# Patient Record
Sex: Female | Born: 1950 | Hispanic: Yes | Smoking: Former smoker
Health system: Northeastern US, Academic
[De-identification: ages and names within clinical notes are randomized; demographics above are authoritative.]

---

## 2018-03-09 LAB — MICROALBUMIN URINE (EXT)
Albumin, urine (INT/EXT): 3.8 mg/dL — ABNORMAL HIGH (ref 0.0–1.9)
Creatinine, urine, random (INT/EXT): 94.5 mg/dL
Microalbumin/Creatinine Ratio Urine (EXT): 40.54 ug/mg — ABNORMAL HIGH (ref 0.00–24.90)

## 2019-05-02 LAB — HEMOGLOBIN A1C
Estimated Average Glucose mg/dL (INT/EXT): 220.2 mg/dL — ABNORMAL HIGH (ref 85.0–126.0)
HEMOGLOBIN A1C % (INT/EXT): 9.3 % — ABNORMAL HIGH (ref 4.0–6.0)

## 2020-08-19 LAB — UNMAPPED LAB RESULTS: HEPATITIS C ANTIBODY (EXT): NOT DETECTED

## 2021-04-04 ENCOUNTER — Encounter (HOSPITAL_BASED_OUTPATIENT_CLINIC_OR_DEPARTMENT_OTHER): Admitting: Neurological Surgery

## 2021-04-04 ENCOUNTER — Ambulatory Visit: Admit: 2021-04-04 | Discharge: 2021-04-04 | Payer: MEDICARE | Attending: Neurological Surgery

## 2021-04-04 ENCOUNTER — Other Ambulatory Visit

## 2021-04-04 ENCOUNTER — Encounter

## 2021-04-04 LAB — ELECTROLYTES PANEL
Anion Gap: 9 mmol/L (ref 3–14)
CO2 (Bicarbonate): 28 mmol/L (ref 20–32)
Chloride: 97 mmol/L — ABNORMAL LOW (ref 98–110)
Potassium: 5.1 mmol/L (ref 3.6–5.2)
Sodium: 134 mmol/L — ABNORMAL LOW (ref 135–146)

## 2021-04-04 LAB — CBC
Hematocrit: 46.6 % (ref 32.0–47.0)
Hemoglobin: 15.8 g/dL (ref 11.0–16.0)
MCH: 31.4 pg (ref 26.0–34.0)
MCHC: 33.9 g/dL (ref 31.0–37.0)
MCV: 92.6 fL (ref 80.0–100.0)
MPV: 9.1 fL (ref 9.1–12.4)
NRBC %: 0 % (ref 0.0–0.0)
NRBC Absolute: 0 10*3/uL (ref 0.00–2.00)
Platelets: 163 10*3/uL (ref 150–400)
RBC: 5.03 M/uL (ref 3.70–5.20)
RDW-CV: 12.7 % (ref 11.5–14.5)
RDW-SD: 42.7 fL (ref 35.0–51.0)
WBC: 5.4 10*3/uL (ref 4.0–11.0)

## 2021-04-04 LAB — CREATININE, SERUM
Creatinine: 0.89 mg/dL (ref 0.55–1.30)
eGFRcr: 70 mL/min/{1.73_m2} (ref >=60)

## 2021-04-04 LAB — GLUCOSE: Glucose: 362 mg/dL — ABNORMAL HIGH (ref 70–139)

## 2021-04-04 LAB — PROTIME-INR
INR: 0.97
Protime: 11.6 s (ref 9.7–14.0)

## 2021-04-04 LAB — PTT: aPTT: 34.6 s (ref 25.7–35.7)

## 2021-04-04 LAB — BUN: BUN: 14 mg/dL (ref 6–24)

## 2021-04-04 NOTE — Progress Notes (Signed)
 Neurosurgery Office Visit     Date of Service: 04/04/2021    Patient: Laura Cunningham  DOB: 10-18-1950  MRN#: [id number]  Primary Care MD: Estil Daft, MD  Referring MD: Estil Daft, MD    Chief Complaint: New patient: right VA stenosis, mid left PCA stenosis     History of Present Illness:  Laura Cunningham is an 70 y.o. 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 She experiences occipital and bitemporal headaches that occurs every day for the past 2 years. She describes these headaches as pressure-like and throbbing that is not relieved with ibuprofen or tylenol. Associated with these headaches is nausea, dry-heaving, photo/phonophobia. Due to fibromyalgia and diabetes, she has peripheral neuropathy and overall generalized weakness.     Medical/Surgical History:  Past Medical History:   Diagnosis Date   . Asthma    . Cervical disc disorder    . Diabetes mellitus (CMS/HCC)    . Gait abnormality    . Hypertension    . Liver disease    . Low back pain    . Osteoporosis    . Peripheral neuropathy      Past Surgical History:   Procedure Laterality Date   . CHOLECYSTECTOMY     . ELBOW SURGERY Right    . ESOPHAGUS SURGERY     . HYSTERECTOMY     . KNEE SURGERY Right     x 2     There is no problem list on file for this patient.    Medications:    Current Outpatient Medications:   .  albuterol 90 mcg/actuation inhaler, Inhale 2 puffs every 6 (six) hours if needed for wheezing., Disp: , Rfl:   .  aspirin 81 mg EC tablet, Take 81 mg by mouth., Disp: , Rfl:   .  blood sugar diagnostic (Blood Glucose Test) strip, , Disp: , Rfl:   .  budesonide (Pulmicort) 0.5 mg/2 mL nebulizer solution, Inhale 0.5 mg., Disp: , Rfl:   .  calcium carbonate-vitamin D3 500 mg-5 mcg (200 unit) tablet, Take 1 tablet by mouth in the morning., Disp: , Rfl:   .  carvedilol (Coreg) 25 mg tablet, Take 25 mg by mouth.,  Disp: , Rfl:   .  clotrimazole (Lotrimin) 1 % vaginal cream, Insert into the vagina in the morning., Disp: , Rfl:   .  diclofenac (Voltaren) 1 % topical gel, Apply topically if needed in the morning, at noon, in the evening, and at bedtime for pain., Disp: , Rfl:   .  DILTIAZEM HCL ORAL, Take 90 mg by mouth., Disp: , Rfl:   .  FAMOTIDINE ORAL, Take 20 mg by mouth., Disp: , Rfl:   .  flash glucose sensor (FREESTYLE LIBRE 2 SENSOR MISC), , Disp: , Rfl:   .  fluconazole (Diflucan) 200 mg tablet, Take 200 mg by mouth in the morning., Disp: , Rfl:   .  FreeStyle glucose monitoring kit, 1 each if needed., Disp: , Rfl:   .  FREESTYLE LANCETS MISC, , Disp: , Rfl:   .  glimepiride (Amaryl) 1 mg tablet, Take 1 mg by mouth before breakfast., Disp: , Rfl:   .  HumaLOG KwikPen Insulin 100 unit/mL injection, Inject 30 Units under the skin in the morning, at noon, and at bedtime., Disp: , Rfl:   .  hydroxychloroquine (Plaquenil) 200 mg tablet, Take 200 mg by mouth., Disp: ,  Rfl:   .  insulin glargine,hum.rec.anlog (BASAGLAR KWIKPEN U-100 INSULIN SUBQ), Inject 100 Units under the skin., Disp: , Rfl:   .  lancets 30 gauge misc, , Disp: , Rfl:   .  losartan (Cozaar) 50 mg tablet, Take 50 mg by mouth in the morning., Disp: , Rfl:   .  lutein/zeaxanthin (OCUVITE LUTEIN 25 ORAL), Take by mouth., Disp: , Rfl:   .  montelukast (Singulair) 10 mg tablet, Take 10 mg by mouth at bedtime., Disp: , Rfl:   .  nitroglycerin (NitrolinguaL) 400 mcg/spray spray, Place 1 spray under the tongue every 5 (five) minutes if needed for chest pain., Disp: , Rfl:   .  pen needle, diabetic (Comfort EZ Pen Needles) 31 gauge x 5/16" needle, , Disp: , Rfl:   .  polyethylene glycol (Glycolax) 17 gram packet, Take 17 g by mouth in the morning., Disp: , Rfl:   .  sertraline (Zoloft) 50 mg tablet, Take 1 tablet by mouth in the morning., Disp: , Rfl:   .  syringe-needle,insulin,0.5 mL (INSULIN SYRINGE MISC), , Disp: , Rfl:   .  triamcinolone (Kenalog) 0.5 % ointment,  Apply topically in the morning and at bedtime., Disp: , Rfl:      Problems and Medications Reviewed.    Allergies:  Patient has no known allergies.     Family History:  Family History   Problem Relation Name Age of Onset   . Cancer Mother     . Breast cancer Sister     . Intestinal malrotation Sister       Social History:   reports that she quit smoking about 47 years ago. Her smoking use included cigarettes. She has never used smokeless tobacco. She reports current alcohol use. She reports that she does not use drugs.      Review of Systems:  All other systems are reviewed and are otherwise negative except as noted in the HPI.     Physical Exam:  Vitals:    04/04/21 1402   BP: (!) 136/45   Pulse: 102     Height: 1.646 m  Weight: 95.3 kg  BMI: Body mass index is 35.16 kg/m.     Mental status: Speech, language, affect and cognition are normal.    Cranial Nerves:  CN II: The pupils are round, equal, and reactive to light. All visual fields are full to confrontation.   CN III,IV,VI: The extraocular movements are normal. There is no abnormal nystagmus. There is no proptosis. Slight right ptosis.  CN VII: Facial nerve function is normal.   CN VIII: Hearing is intact to finger rub bilaterally.   CN XII: The tongue is normal without atrophy. The tongue protrudes in the midline.  Sensory Exam: Sensation is intact to light touch in all extremities.   Motor Exam: Strength is normal in upper and lower extremities bilaterally. There is no focal weakness.  Reflexes: Knee jerk reflexes are 2+ bilaterally.  Gait: Normal.   Cerebellar: Finger to nose testing is normal. There is no tremor or dysmetria.      Assessment/Plan:  In summary, Laura Cunningham is an 70 y.o. 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  We discussed the importance of a diagnostic cerebral angiogram in order  to work-up the area of stenosis and determine any possible treatments beyond her medical therapy for which she is only  on 81 mg once a day at this point.  She would like to proceed with the angiogram and we have tentatively scheduled her for April 14, 2021.    Thank you for allowing me to participate in the care of this patient. Please do not hesitate to contact me with any further questions, comments, or concerns.         Pooja Camuso M. Sherrilyn Rist, M.D., Ph.D  Chief, Neurovascular Surgery  Director, Cerebrovascular & Endovascular Division    [facility]      Department of Neurosurgery  [address]  [place], [place] [address]    Phone: [phone]  Fax: [phone]

## 2021-04-11 ENCOUNTER — Other Ambulatory Visit

## 2021-04-14 ENCOUNTER — Encounter

## 2021-04-14 ENCOUNTER — Other Ambulatory Visit

## 2021-04-14 VITALS — BP 117/77 | HR 97 | Temp 97.7°F | Resp 14 | Ht 64.0 in | Wt 211.0 lb

## 2021-04-14 DIAGNOSIS — I6509 Occlusion and stenosis of unspecified vertebral artery: Secondary | ICD-10-CM

## 2021-04-14 LAB — POCT GLUCOSE: POCT Glucose: 303 mg/dL — ABNORMAL HIGH (ref 70–139)

## 2021-04-14 MED ORDER — midazolam (Versed) injection
1 | INTRAMUSCULAR | Status: AC | PRN
Start: 2021-04-14 — End: 2021-04-14
  Administered 2021-04-14 (×2): .5 via INTRAVENOUS

## 2021-04-14 MED ORDER — iopamidol (Isovue-250) 51 % injection 162 mL
250 | Freq: Once | INTRAVENOUS | Status: AC
Start: 2021-04-14 — End: 2021-04-14
  Administered 2021-04-14: 14:00:00 162 mL via INTRAVENOUS

## 2021-04-14 MED ORDER — heparin lock flush (porcine) 100 unit/mL injection  - Omnicell Override Pull
100 | INTRAVENOUS | Status: AC
Start: 2021-04-14 — End: ?

## 2021-04-14 MED ORDER — hydrALAZINE (Apresoline) 20 mg/mL injection  - Omnicell Override Pull
20 | INTRAMUSCULAR | Status: AC
Start: 2021-04-14 — End: ?

## 2021-04-14 MED ORDER — lidocaine PF (Xylocaine) 20 mg/mL (2 %) injection  - Omnicell Override Pull
20 | INTRAMUSCULAR | Status: AC
Start: 2021-04-14 — End: ?

## 2021-04-14 MED ORDER — midazolam (Versed) 1 mg/mL injection  - Omnicell Override Pull
1 | INTRAMUSCULAR | Status: AC
Start: 2021-04-14 — End: ?

## 2021-04-14 MED ORDER — ceFAZolin (Ancef) 1 gram injection  - Omnicell Override Pull
1 | INTRAMUSCULAR | Status: AC
Start: 2021-04-14 — End: ?

## 2021-04-14 MED ORDER — acetaminophen (Tylenol) 325 mg tablet  - Omnicell Override Pull
325 | ORAL | Status: AC
Start: 2021-04-14 — End: ?

## 2021-04-14 MED ORDER — acetaminophen (Tylenol) suppository 650 mg
650 | RECTAL | Status: DC | PRN
Start: 2021-04-14 — End: 2021-04-15

## 2021-04-14 MED ORDER — octyl 2-cyanoacrylate (Dermabond) liquid  - Omnicell Override Pull
TOPICAL | Status: AC
Start: 2021-04-14 — End: ?

## 2021-04-14 MED ORDER — acetaminophen (Tylenol) solution 650 mg
325 | ORAL | Status: DC | PRN
Start: 2021-04-14 — End: 2021-04-15

## 2021-04-14 MED ORDER — lidocaine PF (Xylocaine) 10 mg/mL (1 %) injection
10 | INTRAMUSCULAR | Status: AC | PRN
Start: 2021-04-14 — End: 2021-04-14
  Administered 2021-04-14: 13:00:00 5 via INTRAMUSCULAR

## 2021-04-14 MED ORDER — ceFAZolin (Ancef) 2 g in sodium chloride 0.9 % 100 mL IVPB-MBP
1 | INTRAVENOUS | Status: AC | PRN
Start: 2021-04-14 — End: 2021-04-14
  Administered 2021-04-14: 14:00:00 2 via INTRAVENOUS

## 2021-04-14 MED ORDER — heparin in NS 4 units/mL infusion  - Omnicell Override Pull
4 | INTRAVENOUS | Status: AC
Start: 2021-04-14 — End: ?

## 2021-04-14 MED ORDER — fentaNYL (Sublimaze) 50 mcg/mL injection  - Omnicell Override Pull
50 | INTRAMUSCULAR | Status: AC
Start: 2021-04-14 — End: ?

## 2021-04-14 MED ORDER — sodium chloride 0.9 % infusion
0.9 | INTRAVENOUS | Status: DC
Start: 2021-04-14 — End: 2021-04-15
  Administered 2021-04-14: 14:00:00 100 mL/h via INTRAVENOUS

## 2021-04-14 MED ORDER — fentaNYL (Sublimaze) injection
50 | INTRAMUSCULAR | Status: AC | PRN
Start: 2021-04-14 — End: 2021-04-14
  Administered 2021-04-14 (×2): 25 via INTRAVENOUS

## 2021-04-14 MED ORDER — acetaminophen (Tylenol) tablet 650 mg
325 | ORAL | Status: DC | PRN
Start: 2021-04-14 — End: 2021-04-15
  Administered 2021-04-14: 14:00:00 650 mg via ORAL

## 2021-04-14 MED FILL — ACETAMINOPHEN 325 MG/10.15 ML ORAL SOLUTION: 325 325 mg/10.15 mL | ORAL | Qty: 20.3

## 2021-04-14 MED FILL — HYDRALAZINE 20 MG/ML INJECTION SOLUTION: 20 20 mg/mL | INTRAMUSCULAR | Qty: 1

## 2021-04-14 MED FILL — MIDAZOLAM 1 MG/ML INJECTION SOLUTION WRAPPER: 1 1 mg/mL | INTRAMUSCULAR | Qty: 2

## 2021-04-14 MED FILL — ACETAMINOPHEN 325 MG TABLET: 325 325 mg | ORAL | Qty: 2

## 2021-04-14 MED FILL — HEPARIN LOCK FLUSH (PORCINE) 100 UNIT/ML INTRAVENOUS SOLUTION: 100 100 unit/mL | INTRAVENOUS | Qty: 5

## 2021-04-14 MED FILL — 2-OCTYL CYANOACRYLATE TOPICAL TISSUE ADHESIVE: TOPICAL | Qty: 1

## 2021-04-14 MED FILL — ACETAMINOPHEN 650 MG RECTAL SUPPOSITORY: 650 650 mg | RECTAL | Qty: 1

## 2021-04-14 MED FILL — FENTANYL (PF) 50 MCG/ML INJECTION SOLUTION: 50 50 mcg/mL | INTRAMUSCULAR | Qty: 2

## 2021-04-14 MED FILL — HEPARIN (PORCINE) 4,000 UNIT/1,000 ML (4 UNIT/ML) IN 0.9% NACL IV SOLN: 4 4 units/mL | INTRAVENOUS | Qty: 4000

## 2021-04-14 MED FILL — LIDOCAINE (PF) 20 MG/ML (2 %) INJECTION SOLUTION: 20 20 mg/mL (2 %) | INTRAMUSCULAR | Qty: 5

## 2021-04-14 MED FILL — CEFAZOLIN 1 GRAM SOLUTION FOR INJECTION: 1 1 gram | INTRAMUSCULAR | Qty: 2000

## 2021-04-14 NOTE — Other (Signed)
 Patient Education   Table of Contents       Moderate Conscious Sedation, Adult, Care After     To view videos and all your education online visit,   [email or web address]   or scan this QR code with your smartphone.                    Moderate Conscious Sedation, Adult, Care After     This sheet gives you information about how to care for yourself after your procedure. Your health care provider may also give you more specific instructions. If you have problems or questions, contact your health care provider.   What can I expect after the procedure?    After the procedure, it is common to have:       Sleepiness for several hours.       Impaired judgment for several hours.       Difficulty with balance.       Vomiting if you eat too soon.     Follow these instructions at home:   For the time period you were told by your health care provider:               Rest.      Do not  participate in activities where you could fall or become injured.      Do not  drive or use machinery.      Do not  drink alcohol.      Do not  take sleeping pills or medicines that cause drowsiness.      Do not  make important decisions or sign legal documents.      Do not  take care of children on your own.       Eating and drinking            Follow the diet recommended by your health care provider.       Drink enough fluid to keep your urine pale yellow.      If you vomit:       Drink water, juice, or soup when you can drink without vomiting.       Make sure you have little or no nausea before eating solid foods.       General instructions         Take over-the-counter and prescription medicines only as told by your health care provider.       Have a responsible adult stay with you for the time you are told. It is important to have someone help care for you until you are awake and alert.      Do not  smoke.       Keep all follow-up visits as told by your health care provider. This is important.       Contact a health care provider if:          You are still sleepy or having trouble with balance after 24 hours.       You feel light-headed.       You keep feeling nauseous or you keep vomiting.       You develop a rash.       You have a fever.       You have redness or swelling around the IV site.     Get help right away if:         You have trouble breathing.       You have new-onset confusion at home.  Summary         After the procedure, it is common to feel sleepy, have impaired judgment, or feel nauseous if you eat too soon.       Rest after you get home. Know the things you should not do after the procedure.       Follow the diet recommended by your health care provider and drink enough fluid to keep your urine pale yellow.       Get help right away if you have trouble breathing or new-onset confusion at home.     This information is not intended to replace advice given to you by your health care provider. Make sure you discuss any questions you have with your health care provider.     Document Released: 10/08/2014Document Revised: 04/16/2021Document Reviewed: 06/01/2019     Elsevier Patient Education ? 2022 Elsevier Inc[employer]

## 2021-04-14 NOTE — Op Note (Signed)
 3 hour bedrest completed. Pt awake, alert, oriented. VSS, remains with mild tachycardia mid 90s. Pt denies any pain at groin site, no hematoma noted. Knee immobilizer removed, pt up and ambulatory to bathroom with 1 assist. Awaiting son arrival for DC paperwork review.

## 2021-04-14 NOTE — Other (Signed)
 Patient Education   Table of Contents       Cerebral Angiogram, Care After     To view videos and all your education online visit,   [email or web address]   or scan this QR code with your smartphone.                    Cerebral Angiogram, Care After     This sheet gives you information about how to care for yourself after your procedure. Your health care provider may also give you more specific instructions. If you have problems or questions, contact your health care provider.   What can I expect after the procedure?    After the procedure, it is common to have:       Bruising and tenderness at the catheter insertion site.       A mild headache.     Follow these instructions at home:   Insertion site care        Follow instructions from your health care provider about how to take care of the insertion site. Make sure you:       Wash your hands with soap and water before and after you change your bandage (dressing). If soap and water are not available, use hand sanitizer.       Change your dressing as told by your health care provider.      Do not  take baths, swim, or use a hot tub until your health care provider approves. You may shower 24?48 hours after the procedure, or as told by your health care provider.      To clean your insertion site:       Gently wash the site with plain soap and water.       Pat the area dry with a clean towel.      Do not  rub the site. This may cause bleeding.      Do not  apply powder or lotion to the site. Keep the site clean and dry.     Infection signs       Check your incision area every day for signs of infection. Check for:       Redness, swelling, or pain.       Fluid or blood.       Warmth.       Pus or a bad smell.     Activity        Do not  drive for 24 hours if you were given a sedative during your procedure.       Rest as told by your health care provider.      Do not  lift anything that is heavier than 10 lb (4.5 kg), or the limit that you are told, until your  health care provider says that it is safe.       Return to your normal activities as told by your health care provider, usually in about a week. Ask your health care provider what activities are safe for you.     General instructions            If your insertion site starts to bleed, lie flat and put pressure on the site. If the bleeding does not stop, get help right away. This is a medical emergency.      Do not  use any products that contain nicotine or tobacco, such as cigarettes, e-cigarettes, and chewing tobacco. If you need help quitting, ask your health  care provider.       Take over-the-counter and prescription medicines only as told by your health care provider.       Drink enough fluid to keep your urine pale yellow. This helps flush the contrast dye from your body.       Keep all follow-up visits as directed by your health care provider. This is important.         Contact a health care provider if:         You have a fever or chills.       You have redness, swelling, or pain around your insertion site.       You have fluid or blood coming from your insertion site.       The insertion site feels warm to the touch.       You have pus or a bad smell coming from your insertion site.       You notice blood collecting in the tissue around the insertion site (hematoma). The hematoma may be painful to the touch.     Get help right away if:         You have chest pain or trouble breathing.       You have severe pain or swelling at the insertion site.       The insertion area bleeds, and bleeding continues after 30 minutes of holding steady pressure on the site.       The arm or leg where the catheter was inserted is pale, cold, numb, tingling, or weak.       You have a rash.      You have any symptoms of a stroke. "BE FAST"  is an easy way to remember the main warning signs of a stroke:      B - Balance  . Signs are dizziness, sudden trouble walking, or loss of balance.      E - Eyes  . Signs are trouble seeing or  a sudden change in vision.      F - Face  . Signs are sudden weakness or numbness of the face, or the face or eyelid drooping on one side.      A - Arms  . Signs are weakness or numbness in an arm. This happens suddenly and usually on one side of the body.      S - Speech  . Signs are sudden trouble speaking, slurred speech, or trouble understanding what people say.      T - Time  . Time to call emergency services. Write down what time symptoms started.      You have other signs of a stroke, such as:       A sudden, severe headache with no known cause.       Nausea or vomiting.       Seizure.     These symptoms may represent a serious problem that is an emergency. Do not wait to see if the symptoms will go away. Get medical help right away. Call your local emergency services (911 in the [place] Do not drive yourself to the hospital.   Summary         Bruising and tenderness at the insertion site are common.       Follow your health care provider's instructions about caring for your insertion site. Change dressing and clean the area as instructed.       If your insertion site bleeds, apply direct pressure until  bleeding stops.       Return to your normal activities as told by your health care provider. Ask what activities are safe.       Rest and drink plenty of fluids.     This information is not intended to replace advice given to you by your health care provider. Make sure you discuss any questions you have with your health care provider.     Document Released: 05/04/2015Document Revised: 07/07/2020Document Reviewed: 01/24/2019     Elsevier Patient Education ? 2022 Elsevier Inc.

## 2021-04-14 NOTE — Op Note (Signed)
 DC instructions reviewed with pt and sons. Pt dressed with RN assistance. Wheelchair out of department.

## 2021-05-09 ENCOUNTER — Encounter (HOSPITAL_BASED_OUTPATIENT_CLINIC_OR_DEPARTMENT_OTHER): Admitting: Neurological Surgery

## 2021-05-09 ENCOUNTER — Other Ambulatory Visit

## 2021-05-09 ENCOUNTER — Ambulatory Visit: Admit: 2021-05-09 | Discharge: 2021-05-09 | Payer: MEDICARE | Attending: Neurological Surgery

## 2021-05-09 VITALS — BP 148/76 | HR 111 | Ht 64.02 in | Wt 212.6 lb

## 2021-05-09 DIAGNOSIS — I672 Cerebral atherosclerosis: Secondary | ICD-10-CM

## 2021-05-09 MED ORDER — prasugrel (Effient) 5 mg tablet
5 | ORAL_TABLET | Freq: Every day | ORAL | 11 refills | Status: AC
Start: 2021-05-09 — End: ?

## 2021-05-09 MED ORDER — atorvastatin (Lipitor) 20 mg tablet
20 | ORAL_TABLET | Freq: Every day | ORAL | 11 refills | 90.00000 days | Status: AC
Start: 2021-05-09 — End: ?

## 2021-05-09 NOTE — Progress Notes (Signed)
 Neurosurgery Office Visit     Date of Service: 05/09/2021    Patient: Laura Cunningham  DOB: May 15, 1951  MRN#: [id number]  Primary Care MD: Estil Daft, MD  Referring MD:     Chief Complaint: right VA stenosis, mid left PCA stenosis     History of Present Illness:  Laura Cunningham is an 70 y.o. 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 She experiences occipital and bitemporal headaches that occurs every day for the past 2 years. She describes these headaches as pressure-like and throbbing that is not relieved with ibuprofen or tylenol. Associated with these headaches is nausea, dry-heaving, photo/phonophobia. Due to fibromyalgia and diabetes, she has peripheral neuropathy and overall generalized weakness.   05/09/21: She returns 1 month later to discuss her angiogram results. She continues to experience her baseline headaches as described above. She also noticed decreased sensation throughout the right side of her face. She continues to feel dizzy. She ambulates with a rolling walker.     Last DCA: 04/14/21  Meds: ASA 81 mg daily    Medical/Surgical History:  Past Medical History:   Diagnosis Date   . Asthma    . Cervical disc disorder    . Diabetes mellitus (CMS/HCC)    . Gait abnormality    . Hypertension    . Liver disease    . Low back pain    . Osteoporosis    . Peripheral neuropathy      Past Surgical History:   Procedure Laterality Date   . CHOLECYSTECTOMY     . ELBOW SURGERY Right    . ESOPHAGUS SURGERY     . HYSTERECTOMY     . KNEE SURGERY Right     x 2     There is no problem list on file for this patient.    Medications:    Current Outpatient Medications:   .  albuterol 90 mcg/actuation inhaler, Inhale 2 puffs every 6 (six) hours if needed for wheezing., Disp: , Rfl:   .  aspirin 81 mg EC tablet, Take 81 mg by mouth., Disp: , Rfl:   .  blood sugar diagnostic (Blood  Glucose Test) strip, , Disp: , Rfl:   .  budesonide (Pulmicort) 0.5 mg/2 mL nebulizer solution, Inhale 0.5 mg., Disp: , Rfl:   .  calcium carbonate-vitamin D3 500 mg-5 mcg (200 unit) tablet, Take 1 tablet by mouth in the morning., Disp: , Rfl:   .  carvedilol (Coreg) 25 mg tablet, Take 25 mg by mouth., Disp: , Rfl:   .  clotrimazole (Lotrimin) 1 % vaginal cream, Insert into the vagina in the morning., Disp: , Rfl:   .  diclofenac (Voltaren) 1 % topical gel, Apply topically if needed in the morning, at noon, in the evening, and at bedtime for pain., Disp: , Rfl:   .  DILTIAZEM HCL ORAL, Take 90 mg by mouth., Disp: , Rfl:   .  FAMOTIDINE ORAL, Take 20 mg by mouth., Disp: , Rfl:   .  flash glucose sensor (FREESTYLE LIBRE 2 SENSOR MISC), , Disp: , Rfl:   .  FreeStyle glucose monitoring kit, 1 each if needed., Disp: , Rfl:   .  HumaLOG KwikPen Insulin 100 unit/mL injection, Inject 30 Units under the skin in the morning, at noon, and at bedtime., Disp: , Rfl:   .  insulin glargine,hum.rec.anlog (BASAGLAR KWIKPEN U-100 INSULIN SUBQ),  Inject 100 Units under the skin., Disp: , Rfl:   .  losartan (Cozaar) 50 mg tablet, Take 50 mg by mouth in the morning., Disp: , Rfl:   .  pen needle, diabetic 31 gauge x 5/16" needle, , Disp: , Rfl:   .  polyethylene glycol (Glycolax) 17 gram packet, Take 17 g by mouth in the morning., Disp: , Rfl:   .  syringe-needle,insulin,0.5 mL (INSULIN SYRINGE MISC), , Disp: , Rfl:   .  fluconazole (Diflucan) 200 mg tablet, Take 200 mg by mouth in the morning., Disp: , Rfl:   .  FREESTYLE LANCETS MISC, , Disp: , Rfl:   .  glimepiride (Amaryl) 1 mg tablet, Take 1 mg by mouth before breakfast., Disp: , Rfl:   .  hydroxychloroquine (Plaquenil) 200 mg tablet, Take 200 mg by mouth., Disp: , Rfl:   .  lancets 30 gauge misc, , Disp: , Rfl:   .  lutein/zeaxanthin (OCUVITE LUTEIN 25 ORAL), Take by mouth., Disp: , Rfl:   .  montelukast (Singulair) 10 mg tablet, Take 10 mg by mouth at bedtime., Disp: , Rfl:   .   nitroglycerin (NitrolinguaL) 400 mcg/spray spray, Place 1 spray under the tongue every 5 (five) minutes if needed for chest pain., Disp: , Rfl:   .  sertraline (Zoloft) 50 mg tablet, Take 1 tablet by mouth in the morning., Disp: , Rfl:   .  triamcinolone (Kenalog) 0.5 % ointment, Apply topically in the morning and at bedtime., Disp: , Rfl:      Problems and Medications Reviewed.    Allergies:  Patient has no known allergies.     Family History:  Family History   Problem Relation Name Age of Onset   . Cancer Mother     . Breast cancer Sister     . Intestinal malrotation Sister       Social History:   reports that she quit smoking about 47 years ago. Her smoking use included cigarettes. She has never used smokeless tobacco. She reports current alcohol use. She reports that she does not use drugs.      Review of Systems:  All other systems are reviewed and are otherwise negative except as noted in the HPI.     Physical Exam:  Vitals:    05/09/21 1458   BP: (!) 148/76   Pulse: 111     Height: 1.626 m  Weight: 96.4 kg  BMI: Body mass index is 36.47 kg/m.     Mental status: Speech, language, affect and cognition are normal.    Cranial Nerves:  CN II: The pupils are round, equal, and reactive to light. All visual fields are full to confrontation.   CN III,IV,VI: The extraocular movements are normal. There is no abnormal nystagmus. There is no proptosis. Slight right ptosis.  CN VII: Facial nerve function is normal.   CN VIII: Hearing is intact to finger rub bilaterally.   CN XII: The tongue is normal without atrophy. The tongue protrudes in the midline.  Sensory Exam: Sensation is intact to light touch in all extremities.   Motor Exam: Strength is normal in upper and lower extremities bilaterally. There is no focal weakness.  Gait: Normal with rolling walker.   Cerebellar: Finger to nose testing is normal. There is no tremor or dysmetria.      Assessment/Plan:  In summary, Laura Cunningham is an 70 y.o. who returns today for  discussion of her diagnostic cerebral angiographic results.  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  Furthermore I have requested that she be seen by my vascular neurology for longitudinal follow-up.  We would like to see her in 6 months for repeat CTA of the head to evaluate follow-up of the intracranial left posterior cerebral artery stenotic lesion and we have also advised to reach out should she develop any new symptoms associated with the stenosis such as visual disturbance.    Thank you for allowing me to participate in the care of this patient. Please do not hesitate to contact me with any further questions, comments, or concerns.         Liara Holm M. Sherrilyn Rist, M.D., Ph.D  Chief, Neurovascular Surgery  Director, Cerebrovascular & Endovascular Division    [facility]      Department of Neurosurgery  [address]  [place], [place] [address]    Phone: [phone]  Fax: [phone]

## 2021-10-08 ENCOUNTER — Encounter

## 2021-11-07 ENCOUNTER — Ambulatory Visit: Admit: 2021-11-07 | Discharge: 2021-11-07 | Payer: MEDICARE | Attending: Neurological Surgery

## 2021-11-07 ENCOUNTER — Other Ambulatory Visit: Admit: 2021-11-07 | Payer: MEDICARE

## 2021-11-07 ENCOUNTER — Encounter

## 2021-11-07 ENCOUNTER — Ambulatory Visit: Payer: MEDICARE

## 2021-11-07 DIAGNOSIS — I679 Cerebrovascular disease, unspecified: Secondary | ICD-10-CM

## 2021-11-07 DIAGNOSIS — G450 Vertebro-basilar artery syndrome: Secondary | ICD-10-CM

## 2021-11-07 LAB — CREATININE, SERUM
Creatinine: 0.65 mg/dL (ref 0.55–1.30)
eGFRcr: 95 mL/min/{1.73_m2} (ref >=60)

## 2021-11-07 LAB — CREATININE, WHOLE BLOOD
Creatinine, Whole Blood: 0.73 mg/dL (ref 0.55–1.30)
eGFRcr, Whole Blood: 89 mL/min/{1.73_m2} (ref >=60)

## 2021-11-07 LAB — P2Y12 PLATELET FUNCTION: P2Y12: 174 [PRU]

## 2021-11-07 MED ORDER — iopamidol (Isovue-370) 370 mg iodine /mL (76 %) injection 70 mL
370 | Freq: Once | INTRAVENOUS | Status: AC
Start: 2021-11-07 — End: 2021-11-07
  Administered 2021-11-07: 15:00:00 70 mL via INTRAVENOUS

## 2021-11-07 NOTE — Progress Notes (Signed)
Neurosurgery Office Visit     Date of Service: 11/07/2021    Patient: Laura Cunningham  DOB: 08-06-1950  MRN#: [id number]  Primary Care MD: Estil Daft, MD  Referring MD:     Chief Complaint: right VA stenosis, mid left PCA stenosis     History of Present Illness:  Laura Cunningham is an 71 y.o. 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 She experiences occipital and bitemporal headaches that occurs every day for the past 2 years. She describes these headaches as pressure-like and throbbing that is not relieved with ibuprofen or tylenol. Associated with these headaches is nausea, dry-heaving, photo/phonophobia. Due to fibromyalgia and diabetes, she has peripheral neuropathy and overall generalized weakness.   05/09/21: She returns 1 month later to discuss her angiogram results. She continues to experience her baseline headaches as described above. She also noticed decreased sensation throughout the right side of her face. She continues to feel dizzy. She ambulates with a rolling walker.     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  She currently complains of right-sided headaches and continued daily vertigo episodes where she feels like she is going to pass out. She states she has weakness all over her body in all extremities and paresthesias in all of her extremities. These symptoms have been progressively worsening over the past several years. She continues to take her DAPT aspirin 81mg  daily and prasugrel 5mg  daily.      Last DCA: 04/14/21  Meds: ASA 81 mg daily, prasugrel 5 mg daily, atorvastatin 20 mg daily    Medical/Surgical History:  Past Medical History:   Diagnosis Date   . Asthma    . Cervical disc disorder    . Diabetes mellitus (CMS/HCC)    . Gait abnormality     . Hypertension    . Liver disease    . Low back pain    . Osteoporosis    . Peripheral neuropathy      Past Surgical History:   Procedure Laterality Date   . CHOLECYSTECTOMY     . ELBOW SURGERY Right    . ESOPHAGUS SURGERY     . HYSTERECTOMY     . KNEE SURGERY Right     x 2     There is no problem list on file for this patient.    Medications:    Current Outpatient Medications:   .  albuterol 90 mcg/actuation inhaler, Inhale 2 puffs every 6 (six) hours if needed for wheezing., Disp: , Rfl:   .  aspirin 81 mg EC tablet, Take 81 mg by mouth., Disp: , Rfl:   .  atorvastatin (Lipitor) 20 mg tablet, Take 1 tablet (20 mg) by mouth in the morning., Disp: 30 tablet, Rfl: 11  .  blood sugar diagnostic (Blood Glucose Test) strip, , Disp: , Rfl:   .  budesonide (Pulmicort) 0.5 mg/2 mL nebulizer solution, Inhale 0.5 mg., Disp: , Rfl:   .  calcium carbonate-vitamin D3 500 mg-5 mcg (200 unit) tablet, Take 1 tablet by mouth in the morning., Disp: , Rfl:   .  carvedilol (Coreg) 25 mg tablet, Take 25 mg by mouth., Disp: , Rfl:   .  clotrimazole (Lotrimin) 1 % vaginal cream, Insert into the vagina in the morning., Disp: , Rfl:   .  diclofenac (Voltaren) 1 % topical gel, Apply topically if needed in the morning, at noon, in the evening, and at bedtime for pain., Disp: , Rfl:   .  DILTIAZEM HCL ORAL, Take 90 mg by mouth., Disp: , Rfl:   .  FAMOTIDINE ORAL, Take 20 mg by mouth., Disp: , Rfl:   .  flash glucose sensor (FREESTYLE LIBRE 2 SENSOR MISC), , Disp: , Rfl:   .  fluconazole (Diflucan) 200 mg tablet, Take 200 mg by mouth in the morning., Disp: , Rfl:   .  FreeStyle glucose monitoring kit, 1 each if needed., Disp: , Rfl:   .  FREESTYLE LANCETS MISC, , Disp: , Rfl:   .  glimepiride (Amaryl) 1 mg tablet, Take 1 mg by mouth before breakfast., Disp: , Rfl:   .  HumaLOG KwikPen Insulin 100 unit/mL injection, Inject 30 Units under the skin in the morning, at noon, and at bedtime., Disp: , Rfl:   .  hydroxychloroquine (Plaquenil) 200 mg  tablet, Take 200 mg by mouth., Disp: , Rfl:   .  insulin glargine,hum.rec.anlog (BASAGLAR KWIKPEN U-100 INSULIN SUBQ), Inject 100 Units under the skin., Disp: , Rfl:   .  lancets 30 gauge misc, , Disp: , Rfl:   .  losartan (Cozaar) 50 mg tablet, Take 50 mg by mouth in the morning., Disp: , Rfl:   .  lutein/zeaxanthin (OCUVITE LUTEIN 25 ORAL), Take by mouth., Disp: , Rfl:   .  montelukast (Singulair) 10 mg tablet, Take 10 mg by mouth at bedtime., Disp: , Rfl:   .  nitroglycerin (NitrolinguaL) 400 mcg/spray spray, Place 1 spray under the tongue every 5 (five) minutes if needed for chest pain., Disp: , Rfl:   .  pen needle, diabetic 31 gauge x 5/16" needle, , Disp: , Rfl:   .  polyethylene glycol (Glycolax) 17 gram packet, Take 17 g by mouth in the morning., Disp: , Rfl:   .  prasugrel (Effient) 5 mg tablet, Take 1 tablet (5 mg) by mouth in the morning., Disp: 30 tablet, Rfl: 11  .  sertraline (Zoloft) 50 mg tablet, Take 1 tablet by mouth in the morning., Disp: , Rfl:   .  syringe-needle,insulin,0.5 mL (INSULIN SYRINGE MISC), , Disp: , Rfl:   .  triamcinolone (Kenalog) 0.5 % ointment, Apply topically in the morning and at bedtime., Disp: , Rfl:      Problems and Medications Reviewed.    Allergies:  Patient has no known allergies.     Family History:  Family History   Problem Relation Name Age of Onset   . Cancer Mother     . Breast cancer Sister     . Intestinal malrotation Sister       Social History:   reports that she quit smoking about 48 years ago. Her smoking use included cigarettes. She has never used smokeless tobacco. She reports current alcohol use. She reports that she does not use drugs.      Review of Systems:  All other systems are reviewed and are otherwise negative except as noted in the HPI.     Physical Exam:  There were no vitals filed for this visit.  Height:    Weight:    BMI: There is no height or weight on file to calculate BMI.     Mental status: Speech, language, affect and cognition are  normal.    Cranial Nerves:  CN II: The pupils are round, equal, and reactive to light. All visual  fields are full to confrontation.   CN III,IV,VI: The extraocular movements are normal. There is no abnormal nystagmus. There is no proptosis. Slight right ptosis.  CN VII: Facial nerve function is normal.   CN VIII: Hearing is intact to finger rub bilaterally.   CN XII: The tongue is normal without atrophy. The tongue protrudes in the midline.  Sensory Exam: Sensation is intact to light touch in all extremities.   Motor Exam: Strength is normal in upper and lower extremities bilaterally. There is no focal weakness.  Gait: Normal with rolling walker.   Cerebellar: Finger to nose testing is normal. There is no tremor or dysmetria.      Assessment/Plan:  In summary, Laura Cunningham is an 71 y.o. who returns today follow-up of her ct angiogram results.  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  This intracranial atherosclerotic disease is likely associated with her history of diabetes. Although she currently endorses symptoms of vertigo and presyncope, this is unlikely due to the left-sided P2 stenosis, this is best treated with maximal medical therapy with DAPT on aspirin 81mg  and prasugrel 5mg  daily and atorvastatin 20mg  daily.     Furthermore I have requested that she be seen by vascular neurology for longitudinal follow-up.      Thank you for allowing me to participate in the care of this patient. Please do not hesitate to contact me with any further questions, comments, or concerns.      Brianna Esson M. Sherrilyn Rist, M.D., Ph.D.  Chief, Neurovascular Surgery  Director, Cerebrovascular & Endovascular Division    [facility]      Department of Neurosurgery  [address]  [place], [place] [address]    Phone: [phone]  Fax: [phone]

## 2022-10-08 LAB — LIPID PROFILE (EXT)
Cholesterol (EXT): 150 mg/dL (ref <200)
HDL Cholesterol (EXT): 41.4 mg/dL — ABNORMAL LOW (ref >=60.0)
LDL Cholesterol, CALC (EXT): 86 mg/dL (ref 0–100)
Risk Factor (EXT): 3.6 (ref 0.0–4.4)
Triglycerides (EXT): 114 mg/dL (ref <150)

## 2022-10-08 LAB — HEMOGLOBIN A1C
Estimated Average Glucose mg/dL (INT/EXT): 257.5 mg/dL — ABNORMAL HIGH (ref 85.0–126.0)
HEMOGLOBIN A1C % (INT/EXT): 10.6 % — ABNORMAL HIGH (ref 4.0–6.0)

## 2023-11-16 IMAGING — MR COL LOMBAR
4 of 5 series · 19 of 48 positions shown · non-contrast
Comparison: none

[Series 4: T2 fat-sat · coronal · 4.0mm · 0.59mm/px · 3 of 12 slices shown]
[im 2/12]
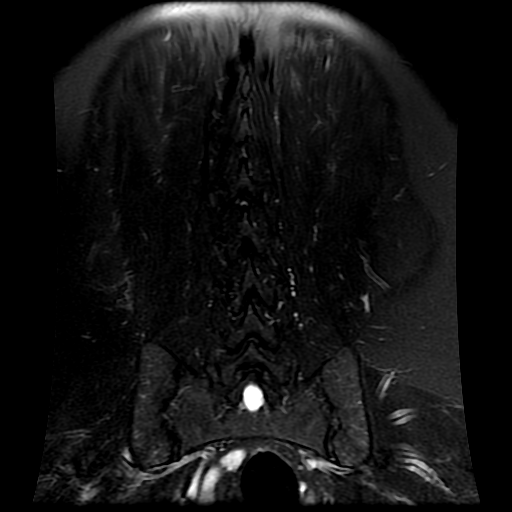
[im 6/12]
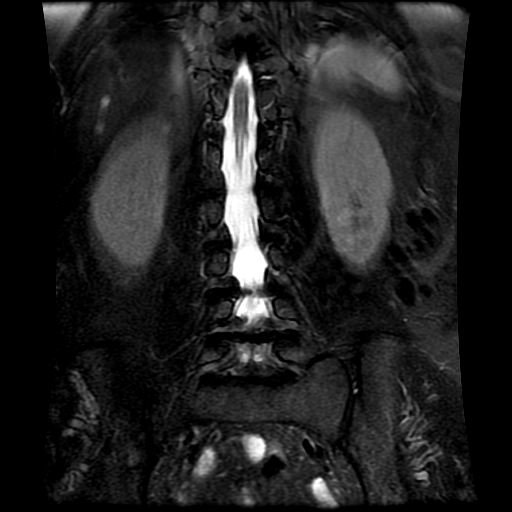
[im 10/12]
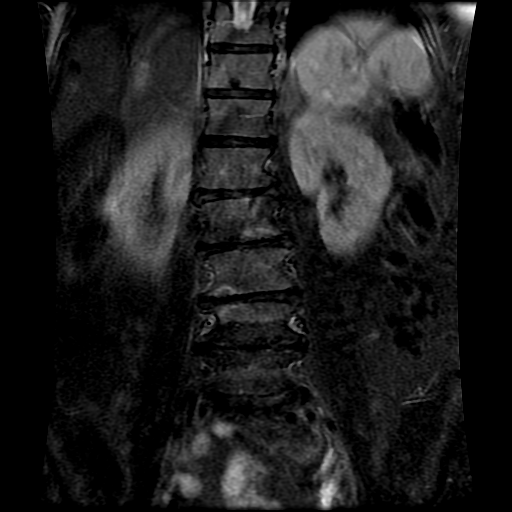

[Series 5: T2 · sagittal · 4.5mm · 0.59mm/px · 9 of 12 slices shown (1 of 2)]
[im 1/12]
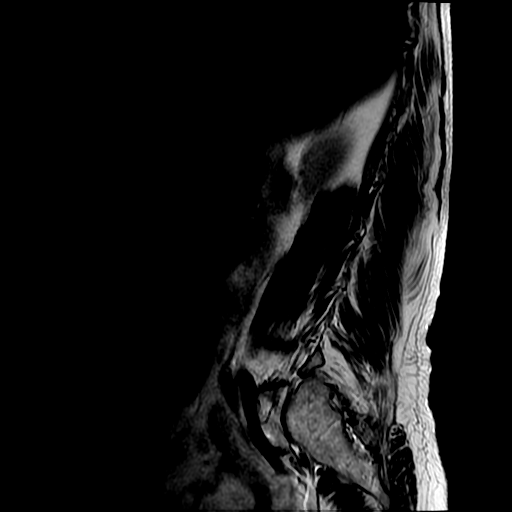
[im 2/12]
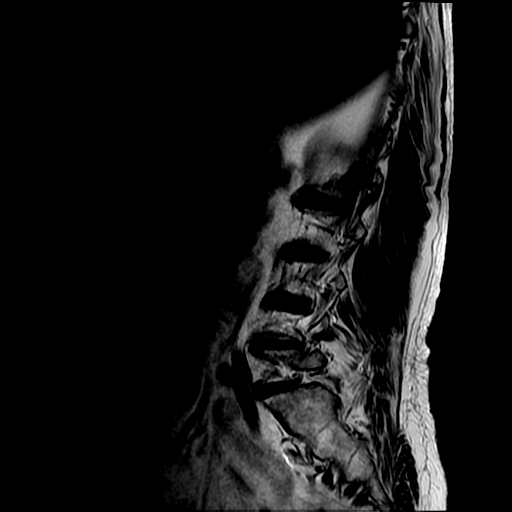
[im 3/12]
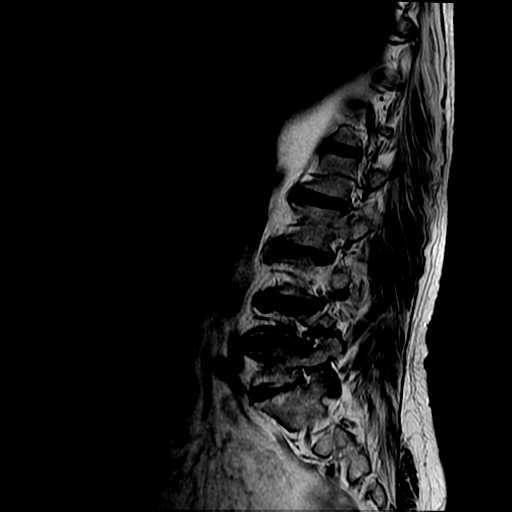
[im 5/12]
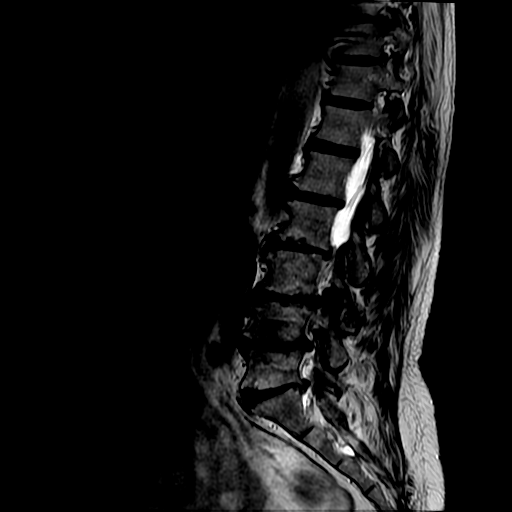
[im 6/12]
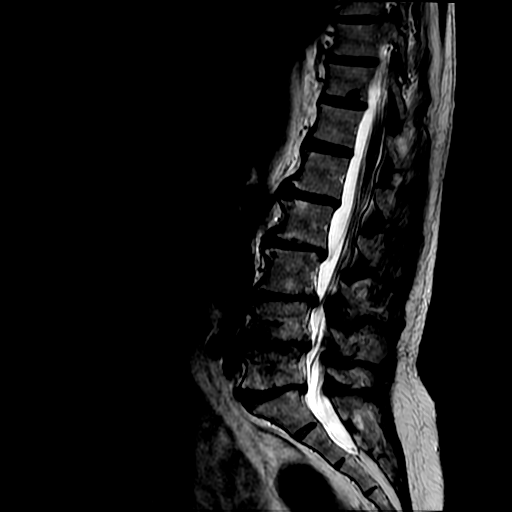
[im 7/12]
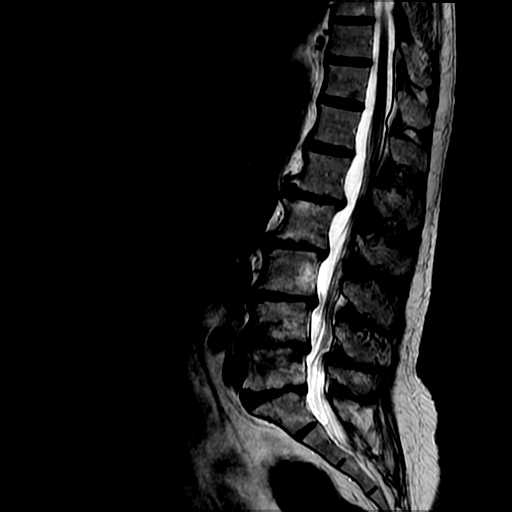
[im 9/12]
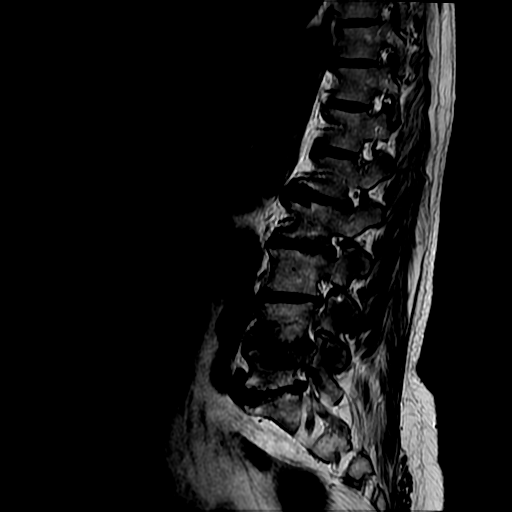
[im 10/12]
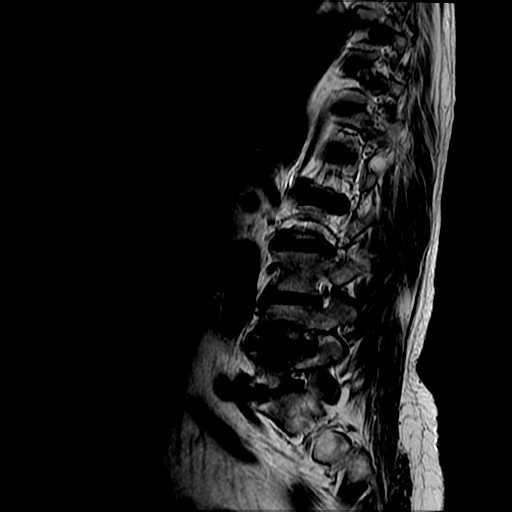
[im 12/12]
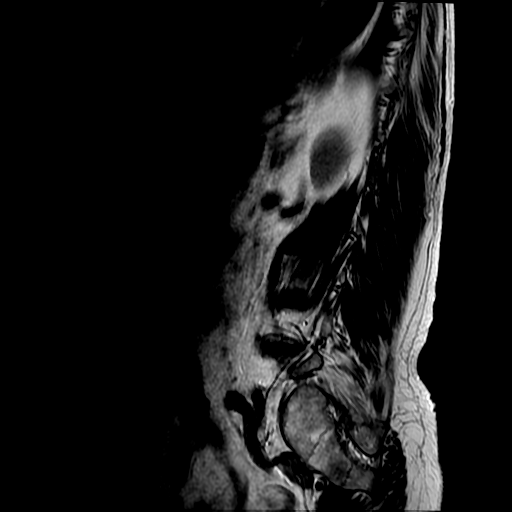

[Series 6: T1 · sagittal · 4.5mm · 0.59mm/px · 3 of 12 slices shown]
[im 2/12]
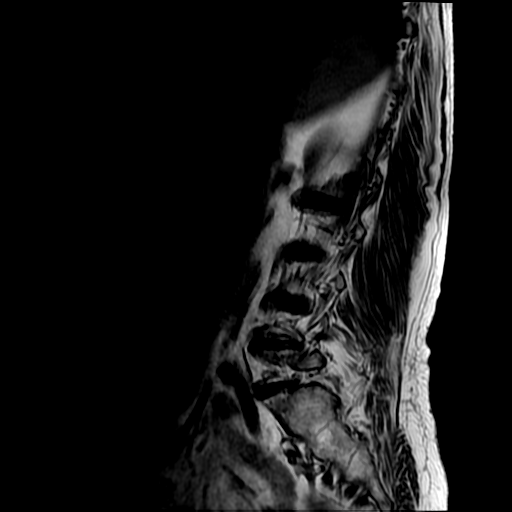
[im 7/12]
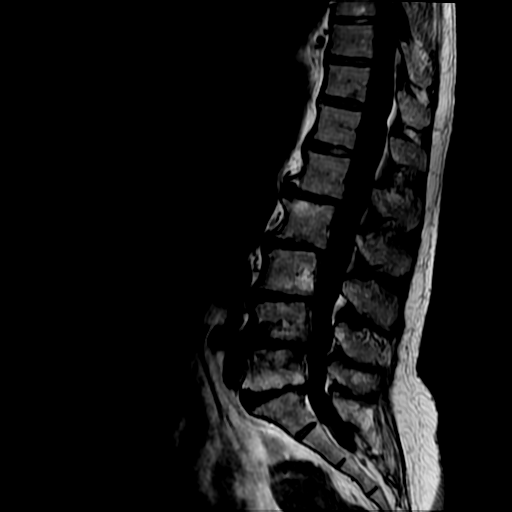
[im 10/12]
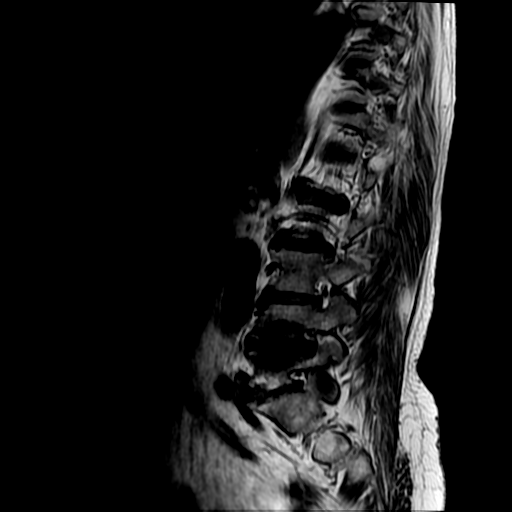

[Series 8: T2 · axial · 4.0mm · 0.39mm/px · z∈[+36,+146]mm · 4 of 20 slices shown (2 of 2)]
[im 1/20]
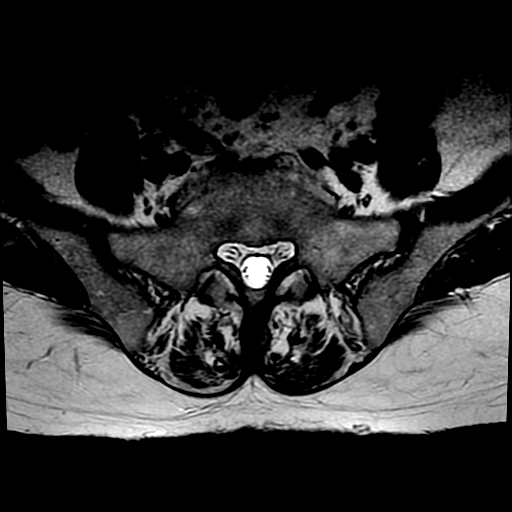
[im 3/20]
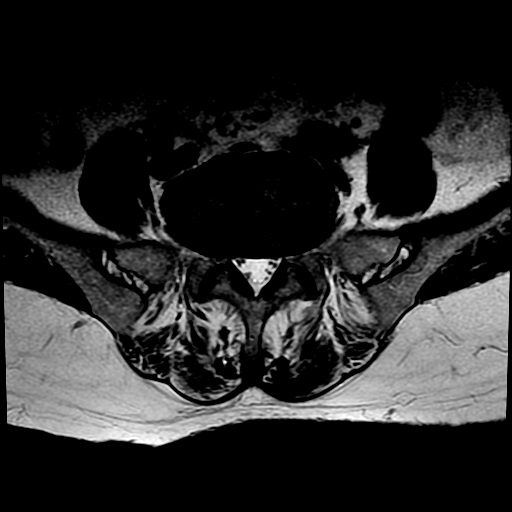
[im 11/20]
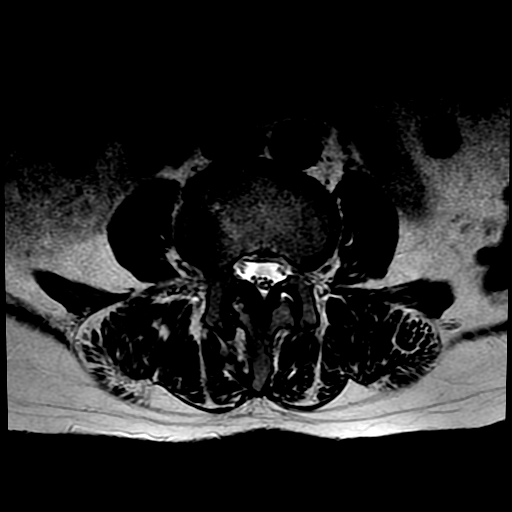
[im 17/20]
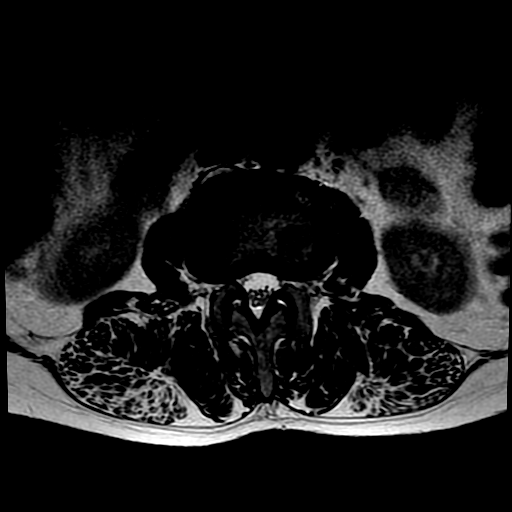

[19 of 48 positions shown; findings below may reference images not displayed]

TÉCNICA:                           
 Exame realizado em aparelho de ressonância magnética de 1,5 Tesla, com aquisição de imagens
em sequências multiplanares com bobinas específicas, ponderadas em T1,T2 e STIR.
DESCRIÇÃO:                               
Osteófitos marginais antero laterais/sindesmófitos anteriores aos corpos vertebrais com artrose das facetas
inter articulares lombares baixo.
Nódulo de schmorl no platô vertebral inferior de T11.
RESSONÂNCIA MAGNÉTICA DE COLUNA  LOMBAR
Alinhamento anatômico preservado.
Desidratação dos  discos  intervertebrais.
Alterações degenerativas dos platôs vertebrais lombares do tipo Modic II.
Abaulamentos discais  assimétricos biforaminais posteriores de L1 a L4 e difusa / simétrica posterior de L4 a
S1, levando a uma compressão anterior do saco dural/raízes ventrais nos respectivos abaulamentos nestes
níveis.
Estreitamento do canal vertebral de L3 a S1.
Medula espinhal com forma, dimensões e intensidade de sinal normal.
Atrofia importante da musculatura paravertebral lombar baixa.
Edema do subcutâneo posterior aos ligamentos supraespinhosos indicando sobrecarga.
CONCLUSÃO: Ressonância Magnética de Coluna lombar evidenciando:
Discopatias degenerativas abauladas  assimétricos biforaminais posteriores de L1 a L4 e difusa /
simétrica posterior de L4 a S1, levando a uma compressão anterior do saco dural/raízes ventrais
nos respectivos abaulamentos nestes níveis.
Estreitamento do canal vertebral de L3 a S1.
Doença degenerativa óssea  e discal.
Demais achados no corpo do laudo.
Arifitri Tigon
Obs: os achados devem ser correlacionados com a clínica.

Arifitri Tigon
